# Patient Record
Sex: Female | Born: 1990 | Race: White | Hispanic: No | Marital: Single | Smoking: Never smoker
Health system: Southern US, Community
[De-identification: ages and names within clinical notes are randomized; demographics above are authoritative.]

---

## 2016-04-27 ENCOUNTER — Emergency Department
Admission: EM | Admit: 2016-04-27 | Discharge: 2016-04-27 | Disposition: A | Discharge status: Home / Self Care | Payer: BLUE CROSS/BLUE SHIELD | Attending: Emergency Medicine | Admitting: Emergency Medicine

## 2016-04-27 ENCOUNTER — Emergency Department: Payer: BLUE CROSS/BLUE SHIELD

## 2016-04-27 DIAGNOSIS — N12 Tubulo-interstitial nephritis, not specified as acute or chronic: Secondary | ICD-10-CM | POA: Insufficient documentation

## 2016-04-27 DIAGNOSIS — Z79899 Other long term (current) drug therapy: Secondary | ICD-10-CM | POA: Insufficient documentation

## 2016-04-27 DIAGNOSIS — R102 Pelvic and perineal pain: Secondary | ICD-10-CM | POA: Insufficient documentation

## 2016-04-27 DIAGNOSIS — R112 Nausea with vomiting, unspecified: Secondary | ICD-10-CM

## 2016-04-27 DIAGNOSIS — R1031 Right lower quadrant pain: Secondary | ICD-10-CM | POA: Diagnosis present

## 2016-04-27 LAB — COMPREHENSIVE METABOLIC PANEL
ALBUMIN: 3.6 g/dL (ref 3.5–5.0)
ALK PHOS: 65 U/L (ref 38–126)
ALT: 21 U/L (ref 14–54)
AST: 35 U/L (ref 15–41)
Anion gap: 7 (ref 5–15)
BUN: 13 mg/dL (ref 6–20)
CALCIUM: 9.1 mg/dL (ref 8.9–10.3)
CO2: 25 mmol/L (ref 22–32)
CREATININE: 0.86 mg/dL (ref 0.44–1.00)
Chloride: 105 mmol/L (ref 101–111)
GFR calc Af Amer: >60 mL/min (ref >60)
GFR calc non Af Amer: >60 mL/min (ref >60)
GLUCOSE: 90 mg/dL (ref 65–99)
Potassium: 3.5 mmol/L (ref 3.5–5.1)
SODIUM: 137 mmol/L (ref 135–145)
Total Bilirubin: 0.5 mg/dL (ref 0.3–1.2)
Total Protein: 7.7 g/dL (ref 6.5–8.1)

## 2016-04-27 LAB — URINALYSIS, COMPLETE (UACMP) WITH MICROSCOPIC
Bilirubin Urine: NEGATIVE
GLUCOSE, UA: NEGATIVE mg/dL
Ketones, ur: NEGATIVE mg/dL
Nitrite: NEGATIVE
PROTEIN: NEGATIVE mg/dL
Specific Gravity, Urine: 1.006 (ref 1.005–1.030)
pH: 8 (ref 5.0–8.0)

## 2016-04-27 LAB — CBC
HCT: 37.9 % (ref 35.0–47.0)
HEMOGLOBIN: 12.7 g/dL (ref 12.0–16.0)
MCH: 29.3 pg (ref 26.0–34.0)
MCHC: 33.4 g/dL (ref 32.0–36.0)
MCV: 87.7 fL (ref 80.0–100.0)
Platelets: 270 10*3/uL (ref 150–440)
RBC: 4.32 MIL/uL (ref 3.80–5.20)
RDW: 16.5 % — ABNORMAL HIGH (ref 11.5–14.5)
WBC: 15 10*3/uL — ABNORMAL HIGH (ref 3.6–11.0)

## 2016-04-27 LAB — POCT PREGNANCY, URINE: PREG TEST UR: NEGATIVE

## 2016-04-27 LAB — LIPASE, BLOOD: Lipase: 19 U/L (ref 11–51)

## 2016-04-27 LAB — HCG, QUANTITATIVE, PREGNANCY: hCG, Beta Chain, Quant, S: <1 m[IU]/mL (ref <5)

## 2016-04-27 MED ORDER — ONDANSETRON 4 MG PO TBDP
8.0000 mg | ORAL_TABLET | Freq: Once | ORAL | Status: AC
  Administered 2016-04-27: 8 mg via ORAL
  Filled 2016-04-27: qty 2

## 2016-04-27 MED ORDER — NAPROXEN 500 MG PO TABS: 500.0000 mg | ORAL_TABLET | Freq: Two times a day (BID) | ORAL | 0 refills | Status: AC

## 2016-04-27 MED ORDER — HYDROMORPHONE HCL 1 MG/ML IJ SOLN
1.0000 mg | INTRAMUSCULAR | Status: AC
  Administered 2016-04-27: 1 mg via INTRAVENOUS
  Filled 2016-04-27: qty 1

## 2016-04-27 MED ORDER — MORPHINE SULFATE (PF) 4 MG/ML IV SOLN
4.0000 mg | Freq: Once | INTRAVENOUS | Status: AC
Start: 2016-04-27 — End: 2016-04-27
  Administered 2016-04-27: 4 mg via INTRAVENOUS
  Filled 2016-04-27: qty 1

## 2016-04-27 MED ORDER — CEFTRIAXONE SODIUM-DEXTROSE 1-3.74 GM-% IV SOLR
1.0000 g | Freq: Once | INTRAVENOUS | Status: AC
  Administered 2016-04-27: 1 g via INTRAVENOUS
  Filled 2016-04-27: qty 50

## 2016-04-27 MED ORDER — SODIUM CHLORIDE 0.9 % IV BOLUS (SEPSIS)
1000.0000 mL | Freq: Once | INTRAVENOUS | Status: AC
  Administered 2016-04-27: 1000 mL via INTRAVENOUS

## 2016-04-27 MED ORDER — METOCLOPRAMIDE HCL 5 MG/ML IJ SOLN
10.0000 mg | Freq: Once | INTRAMUSCULAR | Status: AC
  Administered 2016-04-27: 10 mg via INTRAVENOUS
  Filled 2016-04-27: qty 2

## 2016-04-27 MED ORDER — ONDANSETRON 4 MG PO TBDP: 4.0000 mg | ORAL_TABLET | Freq: Three times a day (TID) | ORAL | 0 refills | Status: AC | PRN

## 2016-04-27 MED ORDER — IOPAMIDOL (ISOVUE-300) INJECTION 61%
15.0000 mL | INTRAVENOUS | Status: AC
  Administered 2016-04-27 (×2): 15 mL via ORAL

## 2016-04-27 MED ORDER — MORPHINE SULFATE (PF) 4 MG/ML IV SOLN
INTRAVENOUS | Status: AC
  Filled 2016-04-27: qty 1

## 2016-04-27 MED ORDER — ONDANSETRON HCL 4 MG/2ML IJ SOLN
4.0000 mg | Freq: Once | INTRAMUSCULAR | Status: AC | PRN
  Administered 2016-04-27: 4 mg via INTRAVENOUS
  Filled 2016-04-27: qty 2

## 2016-04-27 MED ORDER — DEXTROSE 5 % IV BOLUS
1000.0000 mL | Freq: Once | INTRAVENOUS | Status: AC
  Administered 2016-04-27: 1000 mL via INTRAVENOUS
  Filled 2016-04-27: qty 1000

## 2016-04-27 MED ORDER — MORPHINE SULFATE (PF) 4 MG/ML IV SOLN
4.0000 mg | Freq: Once | INTRAVENOUS | Status: AC
  Administered 2016-04-27: 4 mg via INTRAVENOUS

## 2016-04-27 MED ORDER — IOPAMIDOL (ISOVUE-300) INJECTION 61%
75.0000 mL | Freq: Once | INTRAVENOUS | Status: AC | PRN
  Administered 2016-04-27: 75 mL via INTRAVENOUS

## 2016-04-27 MED ORDER — DEXTROSE 5 % IV SOLN: 1.0000 g | Freq: Once | INTRAVENOUS | Status: DC

## 2016-04-27 MED ORDER — CEFPODOXIME PROXETIL 100 MG PO TABS
100.0000 mg | ORAL_TABLET | Freq: Two times a day (BID) | ORAL | 0 refills | Status: AC
Start: 2016-04-27 — End: ?

## 2016-04-27 NOTE — ED Notes (Signed)
Pt still in [place]

## 2016-04-27 NOTE — ED Triage Notes (Signed)
Pt reports vomiting and abdominal pain that began this morning.

## 2016-04-27 NOTE — ED Notes (Signed)
Kristi Kennedy was at bedside to discharge pt. Pt threw up all over floor and RN. Dr Scotty Courtstafford notified. Will treat with fluids and anitemetic and in 30 min if sx resolve dc.

## 2016-04-27 NOTE — ED Notes (Signed)
Aware of need for urine specimen.

## 2016-04-27 NOTE — ED Notes (Signed)
Patient transported to Ultrasound 

## 2016-04-27 NOTE — ED Provider Notes (Signed)
[facility] Emergency Department Provider Note  ____________________________________________  Time seen: Approximately 8:52 AM  I have reviewed the triage vital signs and the nursing notes.   HISTORY  Chief Complaint Abdominal Pain and Emesis    HPI Kristi Kennedy is a 26 y.o. female who was awakened this morning at about 6:45 AM by sudden severe right lower quadrant abdominal pain radiating through to her back. Never had pain like this before. Associated with vomiting and nausea, no diarrhea. No constipation. No history of ovarian cysts. No prior abdominal surgeries. Currently on menses which is normal timing for her. Reports some dysuria this morning but otherwise recently no urinary symptoms. No vaginal discharge.  Pain is worse with movement, no alleviating factors. Severe. Sharp.  No past medical history on file. Aortic regurgitation  There are no active problems to display for this patient.    No past surgical history on file. None  Prior to Admission medications   Medication Sig Start Date End Date Taking? Authorizing Provider  buPROPion (WELLBUTRIN XL) 300 MG 24 hr tablet Take 300 mg by mouth daily.   Yes Historical Provider, MD  escitalopram (LEXAPRO) 20 MG tablet Take 30 mg by mouth daily. 03/12/16  Yes Historical Provider, MD  [name] 3-0.02 MG tablet Take 1 tablet by mouth daily. 03/28/16  Yes Historical Provider, MD  QUEtiapine (SEROQUEL) 200 MG tablet Take 200 mg by mouth at bedtime.   Yes Historical Provider, MD  cefpodoxime (VANTIN) 100 MG tablet Take 1 tablet (100 mg total) by mouth 2 (two) times daily. 04/27/16   Sharman CheekPhillip Jacquees Gongora, MD  naproxen (NAPROSYN) 500 MG tablet Take 1 tablet (500 mg total) by mouth 2 (two) times daily with a meal. 04/27/16   Sharman CheekPhillip Meeya Goldin, MD  ondansetron (ZOFRAN ODT) 4 MG disintegrating tablet Take 1 tablet (4 mg total) by mouth every 8 (eight) hours as needed for nausea or vomiting. 04/27/16   Sharman CheekPhillip Anyah Swallow,  MD  None   Allergies Sulfa antibiotics   No family history on file.  Social History Social History  Substance Use Topics  . Smoking status: Not on file  . Smokeless tobacco: Not on file  . Alcohol use Not on file  No tobacco alcohol or drug use  Review of Systems  Constitutional:   No fever or chills.  ENT:   No sore throat. No rhinorrhea. Cardiovascular:   No chest pain. Respiratory:   No dyspnea or cough. Gastrointestinal:   Positive as above for right lower quadrant abdominal pain with vomiting..  Genitourinary:   Positive dysuria. Musculoskeletal:   Negative for focal pain or swelling Neurological:   Negative for headaches 10-point ROS otherwise negative.  ____________________________________________   PHYSICAL EXAM:  VITAL SIGNS: ED Triage Vitals  Enc Vitals Group     BP 04/27/16 0831 (!) 151/103     Pulse Rate 04/27/16 0831 (!) 108     Resp 04/27/16 0831 18     Temp 04/27/16 0842 97.7 F (36.5 C)     Temp Source 04/27/16 0842 Oral     SpO2 04/27/16 0831 100 %     Weight 04/27/16 0832 105 lb (47.6 kg)     Height 04/27/16 0832 5\' 5"  (1.651 m)     Head Circumference --      Peak Flow --      Pain Score 04/27/16 0832 8     Pain Loc --      Pain Edu? --      Excl. in GC? --  Vital signs reviewed, nursing assessments reviewed.   Constitutional:   Alert and oriented. Very uncomfortable but not in distress. Eyes:   No scleral icterus. No conjunctival pallor. PERRL. EOMI.  No nystagmus. ENT   Head:   Normocephalic and atraumatic.   Nose:   No congestion/rhinnorhea. No septal hematoma   Mouth/Throat:   MMM, no pharyngeal erythema. No peritonsillar mass.    Neck:   No stridor. No SubQ emphysema. No meningismus. Hematological/Lymphatic/Immunilogical:   No cervical lymphadenopathy. Cardiovascular:   RRR. Symmetric bilateral radial and DP pulses.  No murmurs.  Respiratory:   Normal respiratory effort without tachypnea nor retractions. Breath  sounds are clear and equal bilaterally. No wheezes/rales/rhonchi. Gastrointestinal:   Soft with focal right lower quadrant abdominal tenderness. Non distended. There is no CVA tenderness.  No rebound, rigidity, or guarding. Genitourinary:   deferred Musculoskeletal:   Nontender with normal range of motion in all extremities. No joint effusions.  No lower extremity tenderness.  No edema. Neurologic:   Normal speech and language.  CN 2-10 normal. Motor grossly intact. No gross focal neurologic deficits are appreciated.  Skin:    Skin is warm, dry and intact. No rash noted.  No petechiae, purpura, or bullae.  ____________________________________________    LABS (pertinent positives/negatives) (all labs ordered are listed, but only abnormal results are displayed) Labs Reviewed  CBC - Abnormal; Notable for the following:       Result Value   WBC 15.0 (*)    RDW 16.5 (*)    All other components within normal limits  URINALYSIS, COMPLETE (UACMP) WITH MICROSCOPIC - Abnormal; Notable for the following:    Color, Urine STRAW (*)    APPearance CLEAR (*)    Hgb urine dipstick MODERATE (*)    Leukocytes, UA SMALL (*)    Bacteria, UA RARE (*)    Squamous Epithelial / LPF 0-5 (*)    All other components within normal limits  LIPASE, BLOOD  COMPREHENSIVE METABOLIC PANEL  HCG, QUANTITATIVE, PREGNANCY  POC URINE PREG, ED  POCT PREGNANCY, URINE   ____________________________________________   EKG    ____________________________________________    RADIOLOGY  Ultrasound pelvis including Dopplers is unremarkable. CT abdomen and pelvis reveals some perinephric stranding and urothelial thickening consistent with either a recently passed kidney stone on the right side versus pyelonephritis.  ____________________________________________   PROCEDURES Procedures  ____________________________________________   INITIAL IMPRESSION / ASSESSMENT AND PLAN / ED COURSE  Pertinent labs &  imaging results that were available during my care of the patient were reviewed by me and considered in my medical decision making (see chart for details).  26 year old female presents with sudden severe right lower quadrant abdominal pain with tenderness. Reports current menses. Differential includes ovarian torsion, appendicitis, ectopic pregnancy, renal colic, cystitis, STI. Per the patient's report and her own suspicion, doubt sexually transmitted infection or pregnancy, we will obtain pregnancy test. Proceed immediately to ultrasound to evaluate for torsion. If negative, we'll need to obtain a CT scan. No relief after first dose of morphine, we'll give a second dose now. Nausea resolved after Zofran. Continue IV fluids.     Clinical Course as of Apr 27 1520  Mon Apr 27, 2016  0910 Still ongoing severe pain, not relieved by 2 doses of morphine. Called [place] to request study be done immediately with concern for torsion.  They state they will do it as soon as pregnancy test is resulted. Will try to expedite. Serum hcg has been added on.   [PS]  7829 U. HCG neg. [place] updated.   [PS]  1109 [place] neg. Will get CT to eval appy.   [PS]  1354 CT reviewed.  [PS]    Clinical Course User Index [PS] Sharman Cheek, MD    ----------------------------------------- 3:23 PM on 04/27/2016 -----------------------------------------  Patient feeling much better. Calm and comfortable. Persistent tachycardia, but vital signs otherwise unremarkable. His abdominal exam is improved. Clinically and in conjunction with the workup results, I think the patient has recently passed a kidney stone while in the emergency department which extends when she had severe unremitting pain initially and is now much more comfortable. However, because her urinalysis is somewhat equivocal we will send a urine culture and I'll start her on antibiotics. Gave the patient cephalexin in the ED and I'll continue her on cefpodoxime at home. Low  suspicion for STI or PID. ____________________________________________   FINAL CLINICAL IMPRESSION(S) / ED DIAGNOSES  Final diagnoses:  Right lower quadrant abdominal pain  Non-intractable vomiting with nausea, unspecified vomiting type  Pyelonephritis      New Prescriptions   CEFPODOXIME (VANTIN) 100 MG TABLET    Take 1 tablet (100 mg total) by mouth 2 (two) times daily.   NAPROXEN (NAPROSYN) 500 MG TABLET    Take 1 tablet (500 mg total) by mouth 2 (two) times daily with a meal.   ONDANSETRON (ZOFRAN ODT) 4 MG DISINTEGRATING TABLET    Take 1 tablet (4 mg total) by mouth every 8 (eight) hours as needed for nausea or vomiting.     Portions of this note were generated with dragon dictation software. Dictation errors may occur despite best attempts at proofreading.    Sharman Cheek, MD 04/27/16 1524

## 2016-04-27 NOTE — ED Notes (Signed)
Feels better and ready to go home

## 2016-04-27 NOTE — ED Notes (Signed)
Patient transported to CT 

## 2016-04-29 LAB — URINE CULTURE: Culture: >=100000 — AB

## 2017-07-01 IMAGING — CT CT ABD-PELV W/ CM
2 of 4 series · 15 of 46 positions shown, 17 images · IV contrast (APPLIED)
Comparison: None.

CLINICAL DATA: Right lower quadrant pain started this morning

EXAM:
CT ABDOMEN AND PELVIS WITH CONTRAST
TECHNIQUE: Multidetector CT imaging of the abdomen and pelvis was performed
using the standard protocol following bolus administration of
intravenous contrast.
CONTRAST:  75mL OQZYO4-FEE IOPAMIDOL (OQZYO4-FEE) INJECTION 61%

[Series 2: routine abd/pel with · axial · 0.59mm/px · z∈[-982,-597]mm · 12 of 88 slices shown, 14 images]
[im 7/88  soft-tissue]
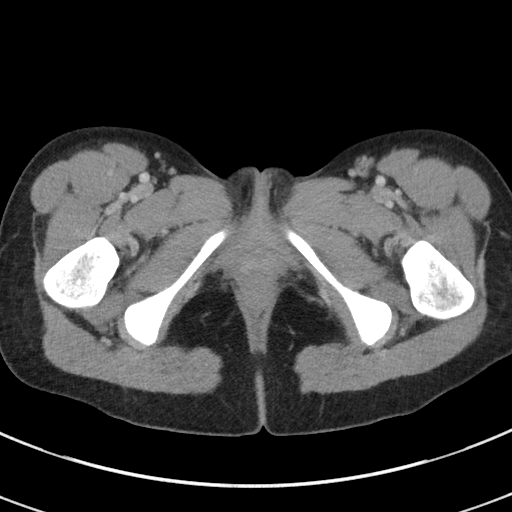
[im 7/88  bone]
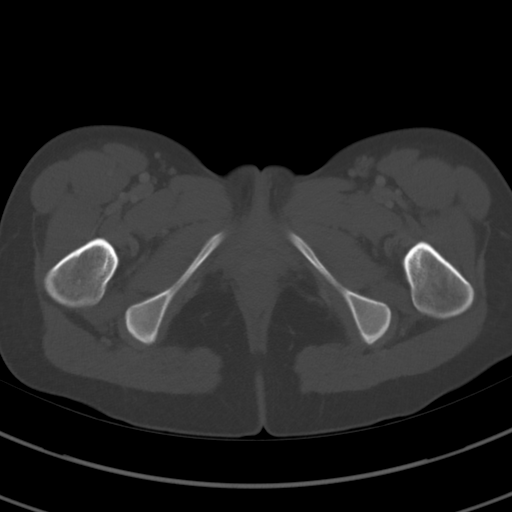
[im 14/88  soft-tissue]
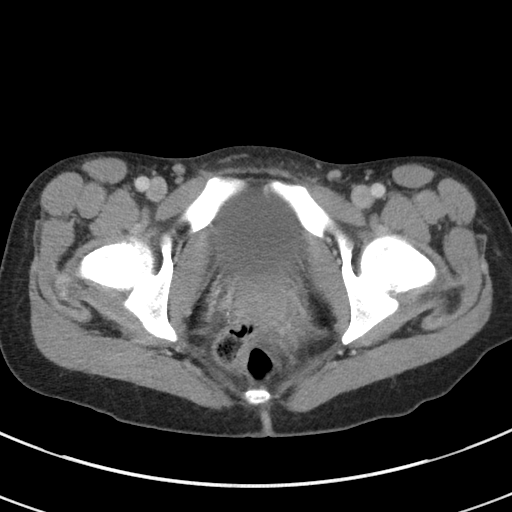
[im 21/88  soft-tissue]
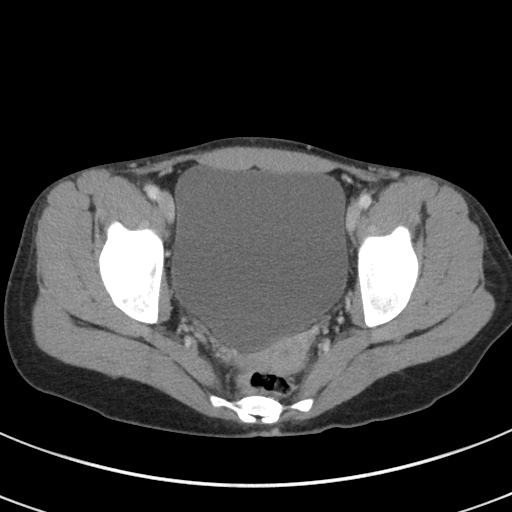
[im 28/88  soft-tissue]
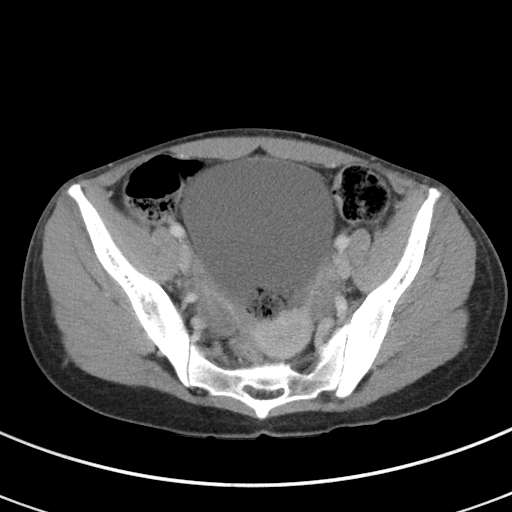
[im 35/88  soft-tissue]
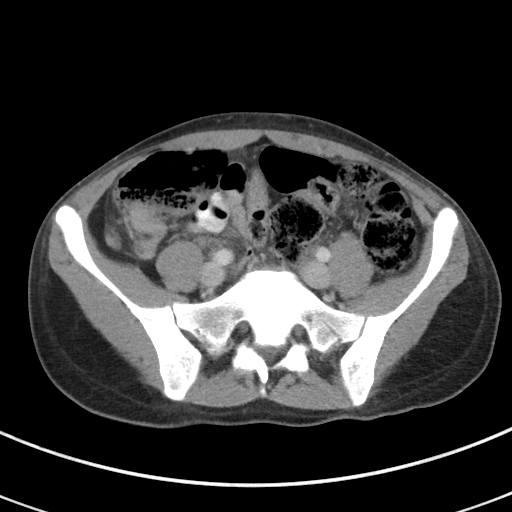
[im 42/88  soft-tissue]
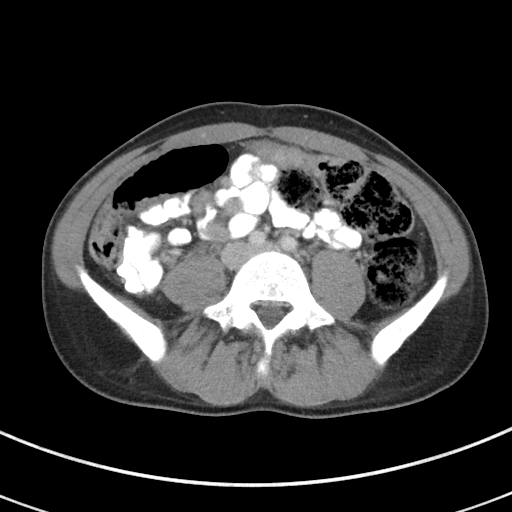
[im 49/88  soft-tissue]
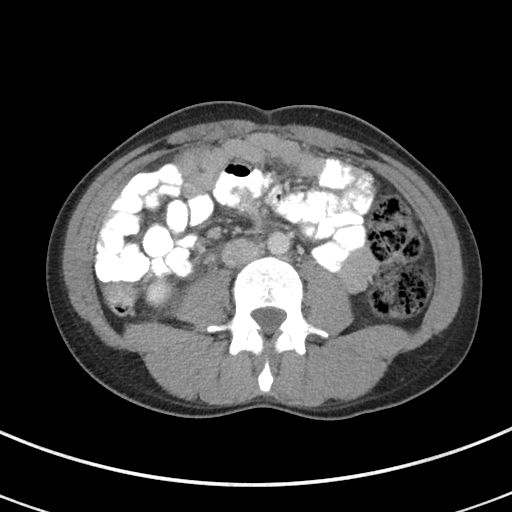
[im 56/88  soft-tissue]
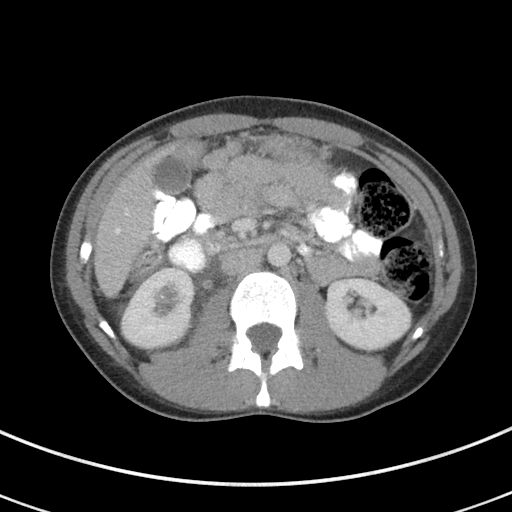
[im 63/88  soft-tissue]
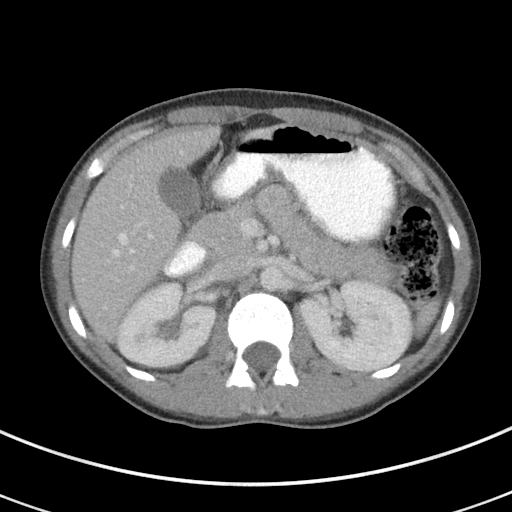
[im 63/88  bone]
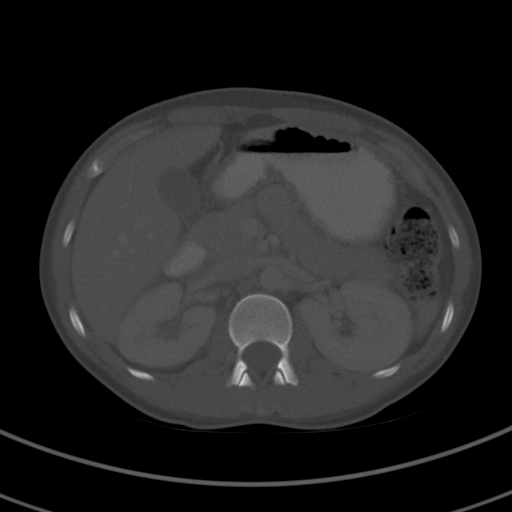
[im 70/88  soft-tissue]
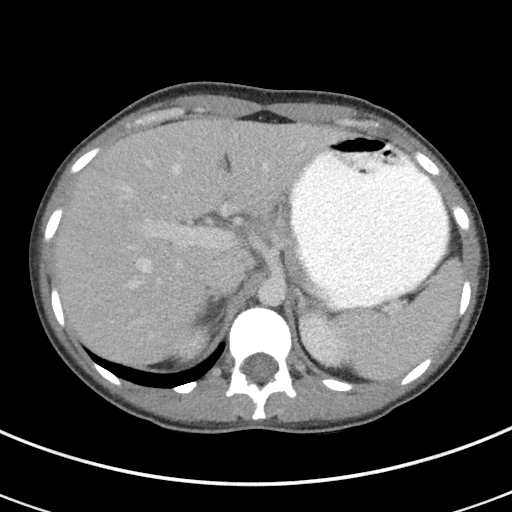
[im 77/88  soft-tissue]
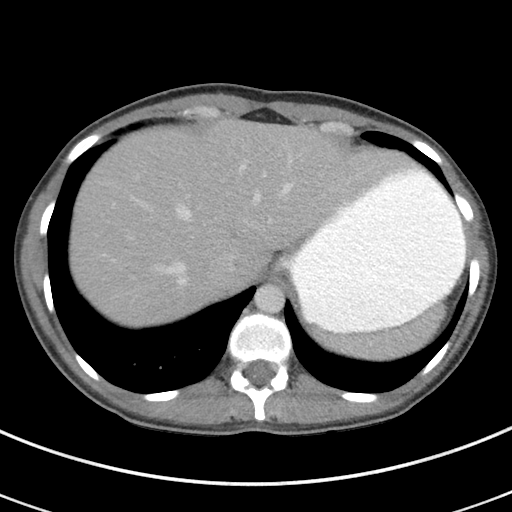
[im 84/88  soft-tissue]
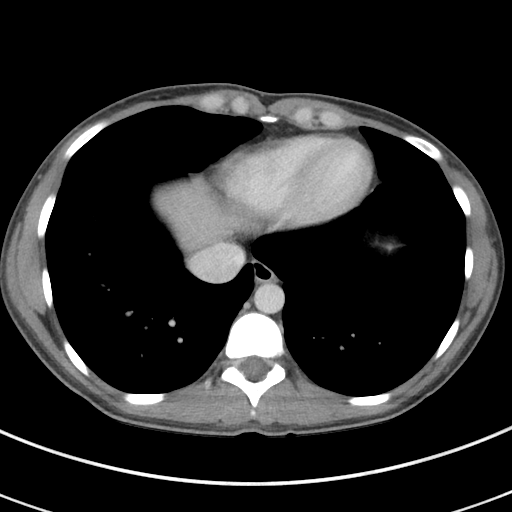

[Series 5: coronal st · coronal · 0.60mm/px · 3 of 70 slices shown]
[im 24/70  soft-tissue]
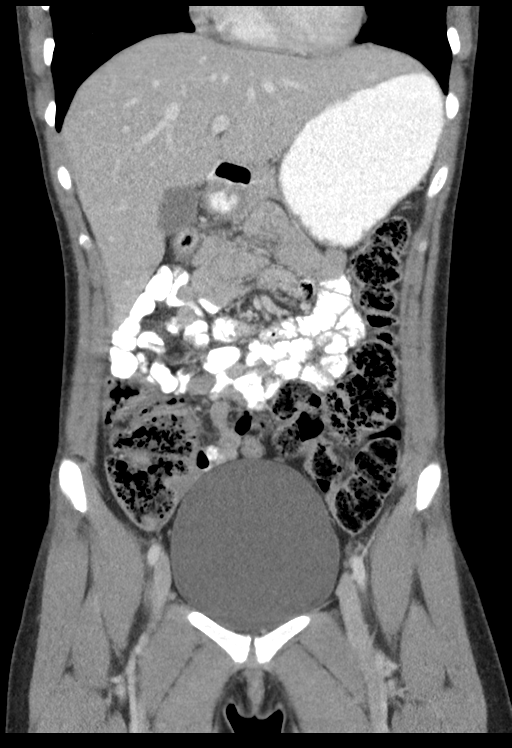
[im 31/70  soft-tissue]
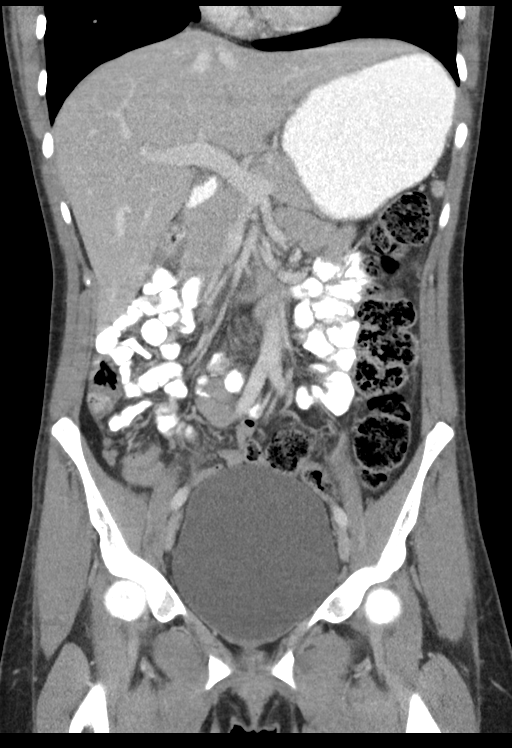
[im 39/70  soft-tissue]
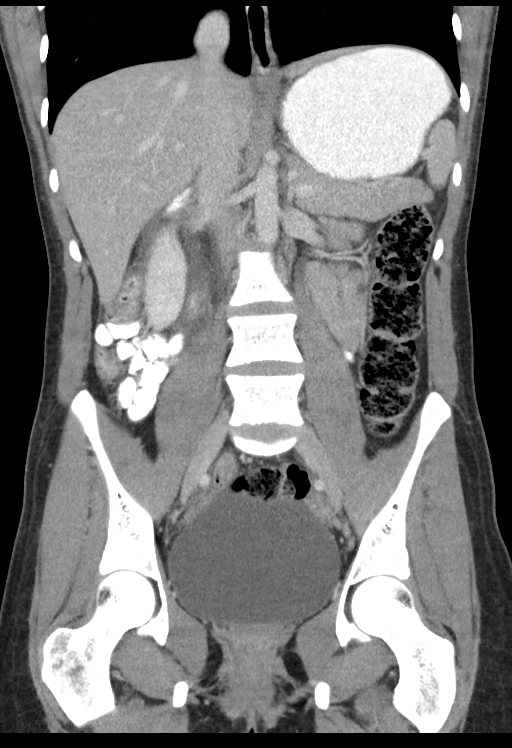

[15 of 46 positions shown; findings below may reference images not displayed]

FINDINGS: Lower chest: No acute abnormality.

Hepatobiliary: No focal liver abnormality is seen. No gallstones,
gallbladder wall thickening, or biliary dilatation.

Pancreas: Unremarkable. No pancreatic ductal dilatation or
surrounding inflammatory changes.

Spleen: Normal in size without focal abnormality.

Adrenals/Urinary Tract: Adrenal glands are unremarkable. Left kidney
is normal. No left urolithiasis. Punctate nonobstructing right renal
calculus. Mild right ureterectasis with urothelial enhancement
without ureterolithiasis. Right perinephric fat stranding. Bladder
is unremarkable.

Stomach/Bowel: Stomach is within normal limits. Normal caliber
appendix without thickening or significant surrounding inflammatory
changes. Large amount of stool throughout the colon. No evidence of
bowel wall thickening, distention, or inflammatory changes.

Vascular/Lymphatic: No significant vascular findings are present. No
enlarged abdominal or pelvic lymph nodes.

Reproductive: Uterus and bilateral adnexa are unremarkable.

Other: No abdominal wall hernia or abnormality. No abdominopelvic
ascites.

Musculoskeletal: No acute or significant osseous findings.
IMPRESSION: 1. Punctate nonobstructing right renal calculus. Mild right
ureterectasis with urothelial enhancement without ureterolithiasis
and perinephric fat stranding which may reflect a recently passed
calculus versus pyelonephritis.
2. Normal appendix.

## 2018-10-27 DIAGNOSIS — F603 Borderline personality disorder: Secondary | ICD-10-CM | POA: Insufficient documentation

## 2018-10-27 DIAGNOSIS — F331 Major depressive disorder, recurrent, moderate: Secondary | ICD-10-CM | POA: Insufficient documentation

## 2018-12-12 ENCOUNTER — Ambulatory Visit (HOSPITAL_COMMUNITY): Payer: 59 | Admitting: Psychiatry

## 2018-12-21 ENCOUNTER — Other Ambulatory Visit: Payer: Self-pay

## 2018-12-21 ENCOUNTER — Encounter (HOSPITAL_COMMUNITY): Payer: Self-pay | Admitting: Psychiatry

## 2018-12-21 ENCOUNTER — Ambulatory Visit (INDEPENDENT_AMBULATORY_CARE_PROVIDER_SITE_OTHER): Payer: 59 | Admitting: Psychiatry

## 2018-12-21 VITALS — Ht 65.0 in | Wt 135.0 lb

## 2018-12-21 DIAGNOSIS — F603 Borderline personality disorder: Secondary | ICD-10-CM | POA: Diagnosis not present

## 2018-12-21 DIAGNOSIS — F331 Major depressive disorder, recurrent, moderate: Secondary | ICD-10-CM

## 2018-12-21 MED ORDER — ESCITALOPRAM OXALATE 20 MG PO TABS: 20.0000 mg | ORAL_TABLET | Freq: Every day | ORAL | 1 refills | Status: DC

## 2018-12-21 MED ORDER — BUPROPION HCL ER (XL) 150 MG PO TB24: 150.0000 mg | ORAL_TABLET | Freq: Every day | ORAL | 1 refills | Status: DC

## 2018-12-21 MED ORDER — QUETIAPINE FUMARATE 200 MG PO TABS: 200.0000 mg | ORAL_TABLET | Freq: Every day | ORAL | 1 refills | Status: DC
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
## 2019-01-20 ENCOUNTER — Ambulatory Visit (HOSPITAL_COMMUNITY): Payer: 59 | Admitting: Psychiatry

## 2019-02-18 ENCOUNTER — Other Ambulatory Visit: Payer: Self-pay

## 2019-02-18 ENCOUNTER — Encounter (HOSPITAL_BASED_OUTPATIENT_CLINIC_OR_DEPARTMENT_OTHER): Payer: Self-pay | Admitting: Emergency Medicine

## 2019-02-18 ENCOUNTER — Other Ambulatory Visit (HOSPITAL_COMMUNITY): Payer: Self-pay | Admitting: Psychiatry

## 2019-02-18 ENCOUNTER — Emergency Department (HOSPITAL_BASED_OUTPATIENT_CLINIC_OR_DEPARTMENT_OTHER)
Admission: EM | Admit: 2019-02-18 | Discharge: 2019-02-18 | Disposition: A | Discharge status: Home / Self Care | Payer: 59 | Attending: Emergency Medicine | Admitting: Emergency Medicine

## 2019-02-18 DIAGNOSIS — R519 Headache, unspecified: Secondary | ICD-10-CM | POA: Diagnosis present

## 2019-02-18 DIAGNOSIS — L731 Pseudofolliculitis barbae: Secondary | ICD-10-CM | POA: Diagnosis not present

## 2019-02-18 DIAGNOSIS — Z79899 Other long term (current) drug therapy: Secondary | ICD-10-CM | POA: Diagnosis not present

## 2019-02-18 MED ORDER — ACETAMINOPHEN 500 MG PO TABS
1000.0000 mg | ORAL_TABLET | Freq: Once | ORAL | Status: AC
  Administered 2019-02-18: 04:00:00 1000 mg via ORAL
  Filled 2019-02-18: qty 2

## 2019-02-18 MED ORDER — IBUPROFEN 800 MG PO TABS
800.0000 mg | ORAL_TABLET | Freq: Once | ORAL | Status: AC
  Administered 2019-02-18: 800 mg via ORAL
  Filled 2019-02-18: qty 1

## 2019-02-18 NOTE — ED Triage Notes (Signed)
Patient presents with complaints of marble sized "bump" near mouth area 3-4 weeks ago; states recently noticed a "bump" on her head; denies any trauma; complains of a headache x "a couple of weeks" patient also co feeling lightheaded and dizzy x "a few" days with sx worse today. Ambulatory with steady gait. Denies any sx of NVD that she can remember.

## 2019-02-18 NOTE — ED Provider Notes (Signed)
[facility] EMERGENCY DEPARTMENT Provider Note   CSN: [id number] Arrival date & time: 02/18/19  9030     History   Chief Complaint Chief Complaint  Patient presents with  . Headache    HPI Kristi Kennedy is a 28 y.o. female.     The history is provided by the patient.  Headache Location: crown of the head where there is a "bump" she had 2 on her face. one on the forehead one near the mouth, both have resolved.  Also only drinking one bottle of water a day and feels lightheaded.   Quality:  Dull Radiates to:  Does not radiate Severity currently:  5/10 Severity at highest:  5/10 Onset quality:  Gradual Timing:  Constant Progression:  Unchanged Chronicity:  New Context: not activity, not exposure to bright light, not caffeine, not coughing, not defecating, not eating, not stress and not exposure to cold air   Relieved by:  Nothing Worsened by:  Nothing Ineffective treatments:  None tried Associated symptoms: no abdominal pain, no back pain, no blurred vision, no congestion, no cough, no diarrhea, no dizziness, no drainage, no ear pain, no eye pain, no facial pain, no fatigue, no fever, no focal weakness, no hearing loss, no loss of balance, no myalgias, no nausea, no near-syncope, no neck pain, no neck stiffness, no numbness, no paresthesias, no photophobia, no seizures, no sinus pressure, no sore throat, no swollen glands, no syncope, no tingling, no URI, no visual change, no vomiting and no weakness   Risk factors: no anger     History reviewed. No pertinent past medical history.  Patient Active Problem List   Diagnosis Date Noted  . Moderate episode of recurrent major depressive disorder (HCC) 10/27/2018  . Borderline personality disorder (HCC) 10/27/2018    Past Surgical History:  Procedure Laterality Date  . HERNIA REPAIR       OB History   No obstetric history on file.      Home Medications    Prior to Admission medications   Medication  Sig Start Date End Date Taking? Authorizing Provider  buPROPion (WELLBUTRIN XL) 150 MG 24 hr tablet Take 1 tablet (150 mg total) by mouth daily. 12/21/18   Thresa Ross, MD  cefpodoxime (VANTIN) 100 MG tablet Take 1 tablet (100 mg total) by mouth 2 (two) times daily. 04/27/16   Sharman Cheek, MD  escitalopram (LEXAPRO) 20 MG tablet Take 1 tablet (20 mg total) by mouth daily. 12/21/18   Thresa Ross, MD  naproxen (NAPROSYN) 500 MG tablet Take 1 tablet (500 mg total) by mouth 2 (two) times daily with a meal. 04/27/16   Sharman Cheek, MD  [name] 3-0.02 MG tablet Take 1 tablet by mouth daily. 03/28/16   [provider]  ondansetron (ZOFRAN ODT) 4 MG disintegrating tablet Take 1 tablet (4 mg total) by mouth every 8 (eight) hours as needed for nausea or vomiting. 04/27/16   Sharman Cheek, MD  QUEtiapine (SEROQUEL) 200 MG tablet Take 1 tablet (200 mg total) by mouth at bedtime. 12/21/18   Thresa Ross, MD    Family History History reviewed. No pertinent family history.  Social History Social History   Tobacco Use  . Smoking status: Never Smoker  . Smokeless tobacco: Never Used  Substance Use Topics  . Alcohol use: Yes  . Drug use: Never     Allergies   Sulfa antibiotics   Review of Systems Review of Systems  Constitutional: Negative for fatigue and fever.  HENT: Negative for  congestion, ear pain, hearing loss, postnasal drip, sinus pressure and sore throat.   Eyes: Negative for blurred vision, photophobia and pain.  Respiratory: Negative for cough.   Cardiovascular: Negative for syncope and near-syncope.  Gastrointestinal: Negative for abdominal pain, diarrhea, nausea and vomiting.  Genitourinary: Negative for difficulty urinating.  Musculoskeletal: Negative for back pain, myalgias, neck pain and neck stiffness.  Neurological: Positive for light-headedness and headaches. Negative for dizziness, tremors, focal weakness, seizures, syncope, facial asymmetry, speech  difficulty, weakness, numbness, paresthesias and loss of balance.  Psychiatric/Behavioral: Negative for agitation.  All other systems reviewed and are negative.    Physical Exam Updated Vital Signs BP (!) 140/99 (BP Location: Right Arm)   Pulse 89   Temp 98.7 F (37.1 C) (Oral)   Resp (!) 25   Ht 5\' 5"  (1.651 m)   Wt 61.2 kg   LMP 01/28/2019   SpO2 100%   BMI 22.47 kg/m   Physical Exam Vitals signs and nursing note reviewed.  Constitutional:      General: She is not in acute distress.    Appearance: She is normal weight.  HENT:     Head: Normocephalic and atraumatic.     Jaw: There is normal jaw occlusion.      Comments: No proptosis intact cognition    Mouth/Throat:     Mouth: Mucous membranes are moist.  Eyes:     Extraocular Movements: Extraocular movements intact.     Pupils: Pupils are equal, round, and reactive to light.  Neck:     Musculoskeletal: Normal range of motion and neck supple.  Cardiovascular:     Rate and Rhythm: Normal rate and regular rhythm.     Pulses: Normal pulses.     Heart sounds: Normal heart sounds.  Pulmonary:     Effort: Pulmonary effort is normal.     Breath sounds: Normal breath sounds.  Abdominal:     General: Abdomen is flat. Bowel sounds are normal.  Musculoskeletal: Normal range of motion.  Lymphadenopathy:     Cervical: No cervical adenopathy.  Skin:    General: Skin is warm and dry.     Capillary Refill: Capillary refill takes less than 2 seconds.  Neurological:     General: No focal deficit present.     Mental Status: She is alert and oriented to person, place, and time.     Cranial Nerves: No cranial nerve deficit.     Sensory: No sensory deficit.     Motor: No weakness.     Deep Tendon Reflexes: Reflexes normal.  Psychiatric:        Mood and Affect: Mood normal.        Behavior: Behavior normal.      ED Treatments / Results  Labs (all labs ordered are listed, but only abnormal results are displayed) Labs  Reviewed - No data to display  EKG None  Radiology No results found.  Procedures Procedures (including critical care time)  Medications Ordered in ED Medications  acetaminophen (TYLENOL) tablet 1,000 mg (has no administration in time range)  ibuprofen (ADVIL) tablet 800 mg (has no administration in time range)     Initial Impression / Assessment and Plan / ED Course  Pain is under an ingrown hair.  Tylenol for pain and warm compresses.  I suspect the other 2 lesions that went away were acne as patient have this on her facial skin.  No weakness or numbness.  No signs of ICH or meningitis or cavernous sinus thrombosis.  Patient is not drinking enough water and this accounts for lightheadedness.    Keysi Oelkers was evaluated in Emergency Department on 02/18/2019 for the symptoms described in the history of present illness. She was evaluated in the context of the global COVID-19 pandemic, which necessitated consideration that the patient might be at risk for infection with the SARS-CoV-2 virus that causes COVID-19. Institutional protocols and algorithms that pertain to the evaluation of patients at risk for COVID-19 are in a state of rapid change based on information released by regulatory bodies including the CDC and federal and state organizations. These policies and algorithms were followed during the patient's care in the ED.   Final Clinical Impressions(s) / ED Diagnoses   Final diagnoses:  Ingrown hair    Return for weakness, numbness, changes in vision or speech, fevers >100.4 unrelieved by medication, shortness of breath, intractable vomiting, or diarrhea, abdominal pain, Inability to tolerate liquids or food, cough, altered mental status or any concerns. No signs of systemic illness or infection. The patient is nontoxic-appearing on exam and vital signs are within normal limits.   I have reviewed the triage vital signs and the nursing notes. Pertinent labs &imaging  results that were available during my care of the patient were reviewed by me and considered in my medical decision making (see chart for details).  After history, exam, and medical workup I feel the patient has been appropriately medically screened and is safe for discharge home. Pertinent diagnoses were discussed with the patient. Patient was given return precautions      Amry Cathy, MD 02/18/19 [id number]

## 2019-03-20 ENCOUNTER — Other Ambulatory Visit (HOSPITAL_COMMUNITY): Payer: Self-pay | Admitting: Psychiatry

## 2019-04-19 ENCOUNTER — Other Ambulatory Visit (HOSPITAL_COMMUNITY): Payer: Self-pay | Admitting: Psychiatry

## 2019-05-26 ENCOUNTER — Other Ambulatory Visit (HOSPITAL_COMMUNITY): Payer: Self-pay | Admitting: Psychiatry

## 2019-05-29 ENCOUNTER — Ambulatory Visit (HOSPITAL_COMMUNITY): Payer: 59 | Admitting: Psychiatry

## 2019-05-31 ENCOUNTER — Ambulatory Visit (INDEPENDENT_AMBULATORY_CARE_PROVIDER_SITE_OTHER): Payer: Self-pay | Admitting: Psychiatry

## 2019-05-31 ENCOUNTER — Encounter (HOSPITAL_COMMUNITY): Payer: Self-pay | Admitting: Psychiatry

## 2019-05-31 DIAGNOSIS — F603 Borderline personality disorder: Secondary | ICD-10-CM

## 2019-05-31 DIAGNOSIS — F331 Major depressive disorder, recurrent, moderate: Secondary | ICD-10-CM

## 2019-05-31 MED ORDER — QUETIAPINE FUMARATE 200 MG PO TABS: ORAL_TABLET | ORAL | 1 refills | Status: DC

## 2019-05-31 MED ORDER — ESCITALOPRAM OXALATE 20 MG PO TABS: 20.0000 mg | ORAL_TABLET | Freq: Every day | ORAL | 1 refills | Status: DC

## 2019-05-31 MED ORDER — BUPROPION HCL ER (XL) 150 MG PO TB24: 150.0000 mg | ORAL_TABLET | Freq: Every day | ORAL | 1 refills | Status: DC
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
## 2019-06-29 ENCOUNTER — Ambulatory Visit (HOSPITAL_COMMUNITY): Payer: Self-pay | Admitting: Psychiatry

## 2019-07-04 ENCOUNTER — Encounter (HOSPITAL_COMMUNITY): Payer: Self-pay | Admitting: Psychiatry

## 2019-07-04 ENCOUNTER — Ambulatory Visit (INDEPENDENT_AMBULATORY_CARE_PROVIDER_SITE_OTHER): Payer: Self-pay | Admitting: Psychiatry

## 2019-07-04 DIAGNOSIS — F331 Major depressive disorder, recurrent, moderate: Secondary | ICD-10-CM

## 2019-07-04 DIAGNOSIS — F603 Borderline personality disorder: Secondary | ICD-10-CM

## 2019-07-04 MED ORDER — QUETIAPINE FUMARATE 200 MG PO TABS: ORAL_TABLET | ORAL | 1 refills | Status: DC

## 2019-07-04 MED ORDER — BUPROPION HCL ER (XL) 150 MG PO TB24: 150.0000 mg | ORAL_TABLET | Freq: Every day | ORAL | 1 refills | Status: DC

## 2019-07-04 MED ORDER — ESCITALOPRAM OXALATE 20 MG PO TABS: 20.0000 mg | ORAL_TABLET | Freq: Every day | ORAL | 1 refills | Status: DC
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
## 2019-07-25 ENCOUNTER — Other Ambulatory Visit (HOSPITAL_COMMUNITY): Payer: Self-pay | Admitting: Psychiatry

## 2019-09-23 ENCOUNTER — Other Ambulatory Visit (HOSPITAL_COMMUNITY): Payer: Self-pay | Admitting: Psychiatry

## 2019-10-06 ENCOUNTER — Encounter (HOSPITAL_COMMUNITY): Payer: Self-pay | Admitting: Psychiatry

## 2019-10-06 ENCOUNTER — Telehealth (INDEPENDENT_AMBULATORY_CARE_PROVIDER_SITE_OTHER): Payer: Self-pay | Admitting: Psychiatry

## 2019-10-06 DIAGNOSIS — F603 Borderline personality disorder: Secondary | ICD-10-CM

## 2019-10-06 DIAGNOSIS — F331 Major depressive disorder, recurrent, moderate: Secondary | ICD-10-CM

## 2019-10-06 MED ORDER — BUPROPION HCL ER (XL) 150 MG PO TB24: 150.0000 mg | ORAL_TABLET | Freq: Every day | ORAL | 1 refills | Status: DC

## 2019-10-06 MED ORDER — QUETIAPINE FUMARATE 200 MG PO TABS: ORAL_TABLET | ORAL | 1 refills | Status: DC

## 2019-10-06 MED ORDER — ESCITALOPRAM OXALATE 20 MG PO TABS: 20.0000 mg | ORAL_TABLET | Freq: Every day | ORAL | 1 refills | Status: DC
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
## 2019-12-30 ENCOUNTER — Other Ambulatory Visit (HOSPITAL_COMMUNITY): Payer: Self-pay | Admitting: Psychiatry

## 2020-01-05 ENCOUNTER — Telehealth (INDEPENDENT_AMBULATORY_CARE_PROVIDER_SITE_OTHER): Payer: Self-pay | Admitting: Psychiatry

## 2020-01-05 ENCOUNTER — Encounter (HOSPITAL_COMMUNITY): Payer: Self-pay | Admitting: Psychiatry

## 2020-01-05 DIAGNOSIS — F331 Major depressive disorder, recurrent, moderate: Secondary | ICD-10-CM

## 2020-01-05 DIAGNOSIS — F603 Borderline personality disorder: Secondary | ICD-10-CM
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
## 2020-02-21 ENCOUNTER — Encounter (HOSPITAL_BASED_OUTPATIENT_CLINIC_OR_DEPARTMENT_OTHER): Payer: Self-pay | Admitting: *Deleted

## 2020-02-21 ENCOUNTER — Emergency Department (HOSPITAL_BASED_OUTPATIENT_CLINIC_OR_DEPARTMENT_OTHER)
Admission: EM | Admit: 2020-02-21 | Discharge: 2020-02-21 | Disposition: A | Discharge status: Home / Self Care | Payer: 59 | Attending: Emergency Medicine | Admitting: Emergency Medicine

## 2020-02-21 ENCOUNTER — Other Ambulatory Visit: Payer: Self-pay

## 2020-02-21 DIAGNOSIS — H5712 Ocular pain, left eye: Secondary | ICD-10-CM | POA: Diagnosis present

## 2020-02-21 DIAGNOSIS — H1032 Unspecified acute conjunctivitis, left eye: Secondary | ICD-10-CM | POA: Insufficient documentation

## 2020-02-21 DIAGNOSIS — H109 Unspecified conjunctivitis: Secondary | ICD-10-CM

## 2020-02-21 MED ORDER — ERYTHROMYCIN 5 MG/GM OP OINT
TOPICAL_OINTMENT | Freq: Once | OPHTHALMIC | Status: AC
  Administered 2020-02-21: 1 via OPHTHALMIC
  Filled 2020-02-21: qty 3.5

## 2020-02-21 NOTE — ED Triage Notes (Signed)
C/o left eye redness x 1 day

## 2020-02-21 NOTE — Discharge Instructions (Signed)
Please use antibiotic eye drop every 4 hours for the next 1 week Follow up with  your PCP for recheck of symptoms next week Return to the ED IMMEDIATELY for any worsening symptoms

## 2020-02-21 NOTE — ED Provider Notes (Signed)
[facility] EMERGENCY DEPARTMENT Provider Note   CSN: [id number] Arrival date & time: 02/21/20  1904     History Chief Complaint  Patient presents with   Eye Problem    Kristi Kennedy is a 29 y.o. female who presents to the ED today with complaint of gradual onset, constant, left eye redness that began this  Morning. Pt reports she woke up with her left eye swollen shut and matted with crusting. She had to use a wash cloth to wipe the crusting away but reports her eye has felt irritated since. She states that she has had watery drainage from the eye all day as well. Pt denies any eye pain. No pain with movement. No fevers or chills. No blurry vision or double vision. No other complaints. Pt does not wear contacts.   The history is provided by the patient and medical records.       History reviewed. No pertinent past medical history.  Patient Active Problem List   Diagnosis Date Noted   Moderate episode of recurrent major depressive disorder (HCC) 10/27/2018   Borderline personality disorder (HCC) 10/27/2018    Past Surgical History:  Procedure Laterality Date   HERNIA REPAIR       OB History   No obstetric history on file.     No family history on file.  Social History   Tobacco Use   Smoking status: Never Smoker   Smokeless tobacco: Never Used  Vaping Use   Vaping Use: Never used  Substance Use Topics   Alcohol use: Yes   Drug use: Never    Home Medications Prior to Admission medications   Medication Sig Start Date End Date Taking? Authorizing Provider  buPROPion (WELLBUTRIN XL) 150 MG 24 hr tablet TAKE ONE TABLET BY MOUTH ONE TIME DAILY 01/01/20   Thresa Ross, MD  cefpodoxime (VANTIN) 100 MG tablet Take 1 tablet (100 mg total) by mouth 2 (two) times daily. 04/27/16   Sharman Cheek, MD  escitalopram (LEXAPRO) 20 MG tablet TAKE ONE TABLET BY MOUTH ONE TIME DAILY 01/01/20   Thresa Ross, MD  naproxen (NAPROSYN) 500 MG tablet Take  1 tablet (500 mg total) by mouth 2 (two) times daily with a meal. 04/27/16   Sharman Cheek, MD  [name] 3-0.02 MG tablet Take 1 tablet by mouth daily. 03/28/16   [provider]  ondansetron (ZOFRAN ODT) 4 MG disintegrating tablet Take 1 tablet (4 mg total) by mouth every 8 (eight) hours as needed for nausea or vomiting. 04/27/16   Sharman Cheek, MD  QUEtiapine (SEROQUEL) 200 MG tablet TAKE ONE TABLET POI EVERY NIGHT AT BEDTIME 10/06/19   Thresa Ross, MD    Allergies    Sulfa antibiotics  Review of Systems   Review of Systems  Constitutional: Negative for chills and fever.  Eyes: Positive for discharge and redness. Negative for photophobia, pain and visual disturbance.    Physical Exam Updated Vital Signs BP (!) 124/98    Pulse (!) 102    Temp 98.3 F (36.8 C) (Oral)    Resp 16    Ht 5\' 6"  (1.676 m)    Wt 63.5 kg    LMP 02/07/2020    SpO2 100%    BMI 22.60 kg/m   Physical Exam Vitals and nursing note reviewed.  Constitutional:      Appearance: She is not ill-appearing.  HENT:     Head: Normocephalic and atraumatic.  Eyes:     Conjunctiva/sclera:     Left  eye: Left conjunctiva is injected.     Comments: + left conjunctival injection with watery discharge from eye. EOMI. No pain with EOM. No periorbital edema.   Cardiovascular:     Rate and Rhythm: Normal rate and regular rhythm.     Pulses: Normal pulses.  Pulmonary:     Effort: Pulmonary effort is normal.     Breath sounds: Normal breath sounds. No wheezing, rhonchi or rales.  Skin:    General: Skin is warm and dry.     Coloration: Skin is not jaundiced.  Neurological:     Mental Status: She is alert.     ED Results / Procedures / Treatments   Labs (all labs ordered are listed, but only abnormal results are displayed) Labs Reviewed - No data to display  EKG None  Radiology No results found.  Procedures Procedures (including critical care time)  Medications Ordered in ED Medications    erythromycin ophthalmic ointment (has no administration in time range)    ED Course  I have reviewed the triage vital signs and the nursing notes.  Pertinent labs & imaging results that were available during my care of the patient were reviewed by me and considered in my medical decision making (see chart for details).    MDM Rules/Calculators/A&P                          29 year old female presents to the ED today complaining of left eye redness/irritation with eye matted shut this morning with crusting surrounding it.  Patient without complaints of visual disturbances.  No pain with eye movement.  No foreign body sensation.  On arrival to the ED patient is afebrile, nontachypneic.  She is mildly tachycardic at 102 however this is dissipated while patient is back in her room.  She is nontoxic-appearing.  She does have a conjunctival injection on the left side with some watery discharge.  Her extraocular movements are intact.  Pulls equal round reactive to light.  There is no concern for preseptal/septal cellulitis at this time.  No trauma to the eye.  Do not feel patient needs fluorescein testing at this time.  We will plan to discharge with erythromycin ointment and have her follow-up with her PCP next week for recheck of symptoms.  Patient instructed to return to the ED for any worsening symptoms.  She is in agreement with plan is stable for discharge.   This note was prepared using Dragon voice recognition software and may include unintentional dictation errors due to the inherent limitations of voice recognition software.   Final Clinical Impression(s) / ED Diagnoses Final diagnoses:  Bacterial conjunctivitis of left eye    Rx / DC Orders ED Discharge Orders    None       Discharge Instructions     Please use antibiotic eye drop every 4 hours for the next 1 week Follow up with  your PCP for recheck of symptoms next week Return to the ED IMMEDIATELY for any worsening symptoms         Tanda Rockers, PA-C 02/21/20 1931    Gwyneth Sprout, MD 02/21/20 2234

## 2020-02-28 ENCOUNTER — Other Ambulatory Visit (HOSPITAL_COMMUNITY): Payer: Self-pay | Admitting: Psychiatry

## 2020-04-28 ENCOUNTER — Other Ambulatory Visit (HOSPITAL_COMMUNITY): Payer: Self-pay | Admitting: Psychiatry

## 2020-05-06 ENCOUNTER — Encounter (HOSPITAL_COMMUNITY): Payer: Self-pay | Admitting: Psychiatry

## 2020-05-06 ENCOUNTER — Telehealth (INDEPENDENT_AMBULATORY_CARE_PROVIDER_SITE_OTHER): Payer: Self-pay | Admitting: Psychiatry

## 2020-05-06 DIAGNOSIS — F603 Borderline personality disorder: Secondary | ICD-10-CM

## 2020-05-06 DIAGNOSIS — F331 Major depressive disorder, recurrent, moderate: Secondary | ICD-10-CM
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
## 2020-05-28 ENCOUNTER — Other Ambulatory Visit (HOSPITAL_COMMUNITY): Payer: Self-pay | Admitting: Psychiatry

## 2020-06-27 ENCOUNTER — Other Ambulatory Visit (HOSPITAL_COMMUNITY): Payer: Self-pay | Admitting: Psychiatry

## 2020-08-05 ENCOUNTER — Telehealth (INDEPENDENT_AMBULATORY_CARE_PROVIDER_SITE_OTHER): Payer: 59 | Admitting: Psychiatry

## 2020-08-05 ENCOUNTER — Encounter (HOSPITAL_COMMUNITY): Payer: Self-pay | Admitting: Psychiatry

## 2020-08-05 DIAGNOSIS — F603 Borderline personality disorder: Secondary | ICD-10-CM

## 2020-08-05 DIAGNOSIS — F331 Major depressive disorder, recurrent, moderate: Secondary | ICD-10-CM | POA: Diagnosis not present

## 2020-08-05 MED ORDER — ESCITALOPRAM OXALATE 20 MG PO TABS: 1.0000 | ORAL_TABLET | Freq: Every day | ORAL | 1 refills | Status: DC

## 2020-08-05 MED ORDER — BUPROPION HCL ER (XL) 150 MG PO TB24: 1.0000 | ORAL_TABLET | Freq: Every day | ORAL | 1 refills | Status: DC
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
## 2020-10-21 ENCOUNTER — Emergency Department (HOSPITAL_BASED_OUTPATIENT_CLINIC_OR_DEPARTMENT_OTHER): Payer: 59

## 2020-10-21 ENCOUNTER — Emergency Department (HOSPITAL_BASED_OUTPATIENT_CLINIC_OR_DEPARTMENT_OTHER)
Admission: EM | Admit: 2020-10-21 | Discharge: 2020-10-21 | Disposition: A | Discharge status: Home / Self Care | Payer: 59 | Attending: Emergency Medicine | Admitting: Emergency Medicine

## 2020-10-21 ENCOUNTER — Other Ambulatory Visit: Payer: Self-pay

## 2020-10-21 ENCOUNTER — Encounter (HOSPITAL_BASED_OUTPATIENT_CLINIC_OR_DEPARTMENT_OTHER): Payer: Self-pay | Admitting: *Deleted

## 2020-10-21 DIAGNOSIS — R6883 Chills (without fever): Secondary | ICD-10-CM | POA: Diagnosis not present

## 2020-10-21 DIAGNOSIS — R112 Nausea with vomiting, unspecified: Secondary | ICD-10-CM | POA: Diagnosis not present

## 2020-10-21 DIAGNOSIS — R000 Tachycardia, unspecified: Secondary | ICD-10-CM | POA: Insufficient documentation

## 2020-10-21 DIAGNOSIS — R110 Nausea: Secondary | ICD-10-CM

## 2020-10-21 LAB — COMPREHENSIVE METABOLIC PANEL
ALT: 20 U/L (ref 0–44)
AST: 29 U/L (ref 15–41)
Albumin: 4.8 g/dL (ref 3.5–5.0)
Alkaline Phosphatase: 64 U/L (ref 38–126)
Anion gap: 9 (ref 5–15)
BUN: 13 mg/dL (ref 6–20)
CO2: 24 mmol/L (ref 22–32)
Calcium: 9.8 mg/dL (ref 8.9–10.3)
Chloride: 105 mmol/L (ref 98–111)
Creatinine, Ser: 0.81 mg/dL (ref 0.44–1.00)
GFR, Estimated: >60 mL/min (ref >60)
Glucose, Bld: 118 mg/dL — ABNORMAL HIGH (ref 70–99)
Potassium: 3.8 mmol/L (ref 3.5–5.1)
Sodium: 138 mmol/L (ref 135–145)
Total Bilirubin: 0.3 mg/dL (ref 0.3–1.2)
Total Protein: 8.7 g/dL — ABNORMAL HIGH (ref 6.5–8.1)

## 2020-10-21 LAB — MAGNESIUM: Magnesium: 1.8 mg/dL (ref 1.7–2.4)

## 2020-10-21 LAB — URINALYSIS, ROUTINE W REFLEX MICROSCOPIC
Bilirubin Urine: NEGATIVE
Glucose, UA: NEGATIVE mg/dL
Hgb urine dipstick: NEGATIVE
Ketones, ur: >80 mg/dL — AB
Leukocytes,Ua: NEGATIVE
Nitrite: NEGATIVE
Protein, ur: NEGATIVE mg/dL
Specific Gravity, Urine: 1.01 (ref 1.005–1.030)
pH: 7 (ref 5.0–8.0)

## 2020-10-21 LAB — CBC WITH DIFFERENTIAL/PLATELET
Abs Immature Granulocytes: 0.05 10*3/uL (ref 0.00–0.07)
Basophils Absolute: 0 10*3/uL (ref 0.0–0.1)
Basophils Relative: 1 %
Eosinophils Absolute: 0 10*3/uL (ref 0.0–0.5)
Eosinophils Relative: 1 %
HCT: 42 % (ref 36.0–46.0)
Hemoglobin: 14.1 g/dL (ref 12.0–15.0)
Immature Granulocytes: 1 %
Lymphocytes Relative: 2 %
Lymphs Abs: 0.2 10*3/uL — ABNORMAL LOW (ref 0.7–4.0)
MCH: 28.9 pg (ref 26.0–34.0)
MCHC: 33.6 g/dL (ref 30.0–36.0)
MCV: 86.1 fL (ref 80.0–100.0)
Monocytes Absolute: 0.5 10*3/uL (ref 0.1–1.0)
Monocytes Relative: 6 %
Neutro Abs: 7.8 10*3/uL — ABNORMAL HIGH (ref 1.7–7.7)
Neutrophils Relative %: 89 %
Platelets: 265 10*3/uL (ref 150–400)
RBC: 4.88 MIL/uL (ref 3.87–5.11)
RDW: 14.3 % (ref 11.5–15.5)
WBC: 8.7 10*3/uL (ref 4.0–10.5)
nRBC: 0 % (ref 0.0–0.2)

## 2020-10-21 LAB — PREGNANCY, URINE: Preg Test, Ur: NEGATIVE

## 2020-10-21 LAB — LIPASE, BLOOD: Lipase: 29 U/L (ref 11–51)

## 2020-10-21 MED ORDER — SODIUM CHLORIDE 0.9 % IV BOLUS
1000.0000 mL | Freq: Once | INTRAVENOUS | Status: AC
  Administered 2020-10-21: 1000 mL via INTRAVENOUS

## 2020-10-21 MED ORDER — ONDANSETRON HCL 4 MG PO TABS: 4.0000 mg | ORAL_TABLET | Freq: Three times a day (TID) | ORAL | 0 refills | Status: AC | PRN

## 2020-10-21 MED ORDER — IOHEXOL 350 MG/ML SOLN
100.0000 mL | Freq: Once | INTRAVENOUS | Status: AC | PRN
  Administered 2020-10-21: 100 mL via INTRAVENOUS

## 2020-10-21 MED ORDER — MAGNESIUM SULFATE 2 GM/50ML IV SOLN
2.0000 g | Freq: Once | INTRAVENOUS | Status: AC
  Administered 2020-10-21: 2 g via INTRAVENOUS
  Filled 2020-10-21: qty 50

## 2020-10-21 MED ORDER — METOPROLOL TARTRATE 5 MG/5ML IV SOLN: 5.0000 mg | Freq: Once | INTRAVENOUS | Status: DC

## 2020-10-21 MED ORDER — ONDANSETRON 4 MG PO TBDP
4.0000 mg | ORAL_TABLET | Freq: Once | ORAL | Status: AC
  Administered 2020-10-21: 4 mg via ORAL
  Filled 2020-10-21: qty 1

## 2020-10-21 MED ORDER — METOPROLOL TARTRATE 5 MG/5ML IV SOLN
5.0000 mg | Freq: Once | INTRAVENOUS | Status: AC
  Administered 2020-10-21: 5 mg via INTRAVENOUS
  Filled 2020-10-21: qty 5

## 2020-10-21 MED ORDER — ONDANSETRON HCL 4 MG/2ML IJ SOLN: 4.0000 mg | Freq: Once | INTRAMUSCULAR | Status: DC

## 2020-10-21 MED ORDER — HALOPERIDOL LACTATE 5 MG/ML IJ SOLN
5.0000 mg | Freq: Once | INTRAMUSCULAR | Status: AC
  Administered 2020-10-21: 5 mg via INTRAVENOUS
  Filled 2020-10-21: qty 1

## 2020-10-21 NOTE — ED Triage Notes (Signed)
Pt reports vomiting onset this am, with chills. Denies abd pain, "just feels sore when I', throwing up".

## 2020-10-21 NOTE — ED Provider Notes (Signed)
[facility] EMERGENCY DEPARTMENT Provider Note   CSN: [id number] Arrival date & time: 10/21/20  1420     History Chief Complaint  Patient presents with   Nausea    Kristi Kennedy is a 30 y.o. female.  The history is provided by the patient.  Emesis Severity:  Moderate Duration:  5 hours Timing:  Constant Progression:  Unchanged Chronicity:  New Recent urination:  Normal Relieved by:  Nothing Worsened by:  Nothing Associated symptoms: no abdominal pain, no arthralgias, no chills, no cough, no fever and no sore throat   Risk factors: no diabetes, no sick contacts and no suspect food intake       History reviewed. No pertinent past medical history.  Patient Active Problem List   Diagnosis Date Noted   Moderate episode of recurrent major depressive disorder (HCC) 10/27/2018   Borderline personality disorder (HCC) 10/27/2018    Past Surgical History:  Procedure Laterality Date   HERNIA REPAIR       OB History   No obstetric history on file.     History reviewed. No pertinent family history.  Social History   Tobacco Use   Smoking status: Never   Smokeless tobacco: Never  Vaping Use   Vaping Use: Never used  Substance Use Topics   Alcohol use: Yes   Drug use: Never    Home Medications Prior to Admission medications   Medication Sig Start Date End Date Taking? Authorizing Provider  ondansetron (ZOFRAN) 4 MG tablet Take 1 tablet (4 mg total) by mouth every 8 (eight) hours as needed for up to 12 doses for nausea or vomiting. 10/21/20  Yes Ramata Strothman, DO  buPROPion (WELLBUTRIN XL) 150 MG 24 hr tablet Take 1 tablet (150 mg total) by mouth daily. 08/05/20   Thresa Ross, MD  cefpodoxime (VANTIN) 100 MG tablet Take 1 tablet (100 mg total) by mouth 2 (two) times daily. 04/27/16   Sharman Cheek, MD  escitalopram (LEXAPRO) 20 MG tablet Take 1 tablet (20 mg total) by mouth daily. 08/05/20   Thresa Ross, MD  naproxen (NAPROSYN) 500 MG tablet  Take 1 tablet (500 mg total) by mouth 2 (two) times daily with a meal. 04/27/16   Sharman Cheek, MD  [name] 3-0.02 MG tablet Take 1 tablet by mouth daily. 03/28/16   [provider]  ondansetron (ZOFRAN ODT) 4 MG disintegrating tablet Take 1 tablet (4 mg total) by mouth every 8 (eight) hours as needed for nausea or vomiting. 04/27/16   Sharman Cheek, MD  QUEtiapine (SEROQUEL) 200 MG tablet TAKE ONE TABLET POI EVERY NIGHT AT BEDTIME 10/06/19   Thresa Ross, MD    Allergies    Sulfa antibiotics  Review of Systems   Review of Systems  Constitutional:  Negative for chills and fever.  HENT:  Negative for ear pain and sore throat.   Eyes:  Negative for pain and visual disturbance.  Respiratory:  Negative for cough and shortness of breath.   Cardiovascular:  Negative for chest pain and palpitations.  Gastrointestinal:  Positive for nausea and vomiting. Negative for abdominal pain.  Genitourinary:  Negative for dysuria and hematuria.  Musculoskeletal:  Negative for arthralgias and back pain.  Skin:  Negative for color change and rash.  Neurological:  Negative for seizures and syncope.  All other systems reviewed and are negative.  Physical Exam Updated Vital Signs BP (!) 100/57 (BP Location: Right Arm)   Pulse (!) 118   Temp 98.8 F (37.1 C) (Oral)  Resp (!) 28   Ht 5\' 6"  (1.676 m)   Wt 63.5 kg   LMP 10/09/2020   SpO2 97%   BMI 22.60 kg/m   Physical Exam Vitals and nursing note reviewed.  Constitutional:      General: She is not in acute distress.    Appearance: She is well-developed. She is ill-appearing.  HENT:     Head: Normocephalic and atraumatic.     Mouth/Throat:     Mouth: Mucous membranes are moist.  Eyes:     Extraocular Movements: Extraocular movements intact.     Conjunctiva/sclera: Conjunctivae normal.     Pupils: Pupils are equal, round, and reactive to light.  Cardiovascular:     Rate and Rhythm: Regular rhythm. Tachycardia present.     Pulses:  Normal pulses.     Heart sounds: Normal heart sounds. No murmur heard. Pulmonary:     Effort: Pulmonary effort is normal. No respiratory distress.     Breath sounds: Normal breath sounds.  Abdominal:     General: Abdomen is flat.     Palpations: Abdomen is soft.     Tenderness: There is no abdominal tenderness. There is no guarding or rebound.     Hernia: No hernia is present.  Musculoskeletal:        General: Normal range of motion.     Cervical back: Neck supple.  Skin:    General: Skin is warm and dry.     Capillary Refill: Capillary refill takes less than 2 seconds.  Neurological:     General: No focal deficit present.     Mental Status: She is alert.  Psychiatric:        Mood and Affect: Mood normal.    ED Results / Procedures / Treatments   Labs (all labs ordered are listed, but only abnormal results are displayed) Labs Reviewed  CBC WITH DIFFERENTIAL/PLATELET - Abnormal; Notable for the following components:      Result Value   Neutro Abs 7.8 (*)    Lymphs Abs 0.2 (*)    All other components within normal limits  COMPREHENSIVE METABOLIC PANEL - Abnormal; Notable for the following components:   Glucose, Bld 118 (*)    Total Protein 8.7 (*)    All other components within normal limits  URINALYSIS, ROUTINE W REFLEX MICROSCOPIC - Abnormal; Notable for the following components:   Ketones, ur >80 (*)    All other components within normal limits  URINE CULTURE  LIPASE, BLOOD  PREGNANCY, URINE  MAGNESIUM    EKG EKG Interpretation  Date/Time:  Monday October 21 2020 15:17:07 EDT Ventricular Rate:  129 PR Interval:  143 QRS Duration: 90 QT Interval:  297 QTC Calculation: 435 R Axis:   84 Text Interpretation: Sinus tachycardia Borderline T abnormalities, inferior leads Confirmed by [id number] ([id number]) on 10/21/2020 3:58:01 PM  Radiology CT Angio Chest PE W and/or Wo Contrast  Result Date: 10/21/2020 CLINICAL DATA:  Vomiting.  Chills. EXAM: CT CHEST WITH CONTRAST CT  ABDOMEN AND PELVIS WITH  CONTRAST TECHNIQUE: Multidetector CT imaging of the chest was performed during intravenous contrast administration. Multidetector CT imaging of the abdomen and pelvis was performed following the standard protocol during bolus administration of intravenous contrast. CONTRAST:  12/22/2020 OMNIPAQUE IOHEXOL 350 MG/ML SOLN COMPARISON:  CT abdomen 04/27/2016, chest radiograph same day FINDINGS: CT CHEST FINDINGS Cardiovascular: No filling defects within the pulmonary arteries to suggest acute pulmonary embolism. Aorta appears normal. Cardiac motion at the root of the aorta. No pericardial  fluid. Small amount residual thymus in the anterior mediastinum Mediastinum/Nodes: No axillary or supraclavicular adenopathy. No mediastinal or hilar adenopathy. No pericardial fluid. Esophagus normal. Lungs/Pleura: No pulmonary infarction. No pneumonia. Mild basilar atelectasis. No nodularity. Musculoskeletal: No acute osseous abnormality. CT ABDOMEN AND PELVIS FINDINGS Hepatobiliary: No focal hepatic lesion. No biliary duct dilatation. Common bile duct is normal. Pancreas: Pancreas is normal. No ductal dilatation. No pancreatic inflammation. Spleen: Normal spleen Adrenals/urinary tract: Adrenal glands and kidneys are normal. The ureters and bladder normal. Stomach/Bowel: Stomach, duodenum small-bowel normal. Terminal ileum normal. Appendix is partially imaged (image 67/4) and is normal. Ascending, transverse, and descending colon normal. Rectosigmoid colon normal. Vascular/Lymphatic: Abdominal aorta is normal caliber. No periportal or retroperitoneal adenopathy. No pelvic adenopathy. Reproductive: Uterus and adnexa are normal. Dominant follicle in the LEFT ovary measures 19 mm Other: No free fluid. Musculoskeletal: No aggressive osseous lesion. IMPRESSION: Chest Impression: 1. No evidence acute pulmonary embolism. 2. Mild basilar atelectasis. 3. No pneumonia or infarction. Abdomen / Pelvis Impression: 1. No acute  findings in the abdomen pelvis. 2. Normal gallbladder and appendix. 3. No obstructive uropathy. Electronically Signed   By: Genevive Bi M.D.   On: 10/21/2020 17:52   CT ABDOMEN PELVIS W CONTRAST  Result Date: 10/21/2020 CLINICAL DATA:  Vomiting.  Chills. EXAM: CT CHEST WITH CONTRAST CT ABDOMEN AND PELVIS WITH  CONTRAST TECHNIQUE: Multidetector CT imaging of the chest was performed during intravenous contrast administration. Multidetector CT imaging of the abdomen and pelvis was performed following the standard protocol during bolus administration of intravenous contrast. CONTRAST:  OMNIPAQUE IOHEXOL 350 MG/ML SOLN COMPARISON:  CT abdomen 04/27/2016, chest radiograph same day FINDINGS: CT CHEST FINDINGS Cardiovascular: No filling defects within the pulmonary arteries to suggest acute pulmonary embolism. Aorta appears normal. Cardiac motion at the root of the aorta. No pericardial fluid. Small amount residual thymus in the anterior mediastinum Mediastinum/Nodes: No axillary or supraclavicular adenopathy. No mediastinal or hilar adenopathy. No pericardial fluid. Esophagus normal. Lungs/Pleura: No pulmonary infarction. No pneumonia. Mild basilar atelectasis. No nodularity. Musculoskeletal: No acute osseous abnormality. CT ABDOMEN AND PELVIS FINDINGS Hepatobiliary: No focal hepatic lesion. No biliary duct dilatation. Common bile duct is normal. Pancreas: Pancreas is normal. No ductal dilatation. No pancreatic inflammation. Spleen: Normal spleen Adrenals/urinary tract: Adrenal glands and kidneys are normal. The ureters and bladder normal. Stomach/Bowel: Stomach, duodenum small-bowel normal. Terminal ileum normal. Appendix is partially imaged (image 67/4) and is normal. Ascending, transverse, and descending colon normal. Rectosigmoid colon normal. Vascular/Lymphatic: Abdominal aorta is normal caliber. No periportal or retroperitoneal adenopathy. No pelvic adenopathy. Reproductive: Uterus and adnexa are  normal. Dominant follicle in the LEFT ovary measures 19 mm Other: No free fluid. Musculoskeletal: No aggressive osseous lesion. IMPRESSION: Chest Impression: 1. No evidence acute pulmonary embolism. 2. Mild basilar atelectasis. 3. No pneumonia or infarction. Abdomen / Pelvis Impression: 1. No acute findings in the abdomen pelvis. 2. Normal gallbladder and appendix. 3. No obstructive uropathy. Electronically Signed   By: Genevive Bi M.D.   On: 10/21/2020 17:52   DG Chest Portable 1 View  Result Date: 10/21/2020 CLINICAL DATA:  Vomiting EXAM: PORTABLE CHEST 1 VIEW COMPARISON:  None. FINDINGS: The heart size and mediastinal contours are within normal limits. Both lungs are clear. The visualized skeletal structures are unremarkable. IMPRESSION: No active disease. Electronically Signed   By: Jasmine Pang M.D.   On: 10/21/2020 16:58    Procedures Procedures   Medications Ordered in ED Medications  sodium chloride 0.9 % bolus 1,000 mL (0  mLs Intravenous Stopped 10/21/20 1617)  haloperidol lactate (HALDOL) injection 5 mg (5 mg Intravenous Given 10/21/20 1521)  ondansetron (ZOFRAN-ODT) disintegrating tablet 4 mg (4 mg Oral Given 10/21/20 1521)  sodium chloride 0.9 % bolus 1,000 mL ( Intravenous Stopped 10/21/20 1919)  magnesium sulfate IVPB 2 g 50 mL (0 g Intravenous Stopped 10/21/20 1831)  iohexol (OMNIPAQUE) 350 MG/ML injection 100 mL (100 mLs Intravenous Contrast Given 10/21/20 1730)  metoprolol tartrate (LOPRESSOR) injection 5 mg (5 mg Intravenous Given 10/21/20 1823)    ED Course  I have reviewed the triage vital signs and the nursing notes.  Pertinent labs & imaging results that were available during my care of the patient were reviewed by me and considered in my medical decision making (see chart for details).    MDM Rules/Calculators/A&P                          Lars PinksKristena Ostermann is here with nausea and vomiting.  Normal vitals except for mild tachycardia.  Nausea and vomiting started this  morning.  Fairly persistent.  Denies any sick contacts or suspicious food intake.  Denies any abdominal pain.  No history of the same.  Denies any urine symptoms.  Denies any marijuana use.  Overall she appears uncomfortable and actively dry heaving on exam.  EKG shows sinus tachycardia.  Will give IV Haldol, fluid bolus and check basic labs including urinalysis and reevaluate.  Have low suspicion for appendicitis or colitis or bowel obstruction given no abdominal tenderness.  Urinalysis negative for infection.  No significant leukocytosis, anemia, electrolyte ab Mody, kidney injury.  Felt better after Haldol and Zofran.  Tachycardia was still persistent therefore PE study and CT abdomen and pelvis was pursued.  Patient had no blood clot, no pulmonary infection.  No intra-abdominal infection.  Given additional dose IV fluids and IV Lopressor and will reevaluate.  Monitor shows sinus tachycardia at 130s.  Large ketones in the urinalysis and suspect tachycardia is likely from dehydration despite no AKI.  She might have given herself some type of atrial tachycardia as well from the vomiting.  After dose of Lopressor.  Patient with sinus tachycardia at 105.  Will allow fluids to finish and reevaluate.  Patient feeling much better after IV fluids.  Will prescribe Zofran to use as needed.  Discharged in good condition.  Overall suspect viral process.  This chart was dictated using voice recognition software.  Despite best efforts to proofread,  errors can occur which can change the documentation meaning.   Final Clinical Impression(s) / ED Diagnoses Final diagnoses:  Nausea    Rx / DC Orders ED Discharge Orders          Ordered    ondansetron (ZOFRAN) 4 MG tablet  Every 8 hours PRN        10/21/20 1928             Virgina NorfolkCuratolo, Cynai Skeens, DO 10/21/20 1929

## 2020-10-22 LAB — URINE CULTURE: Culture: <10000 — AB

## 2020-10-25 ENCOUNTER — Other Ambulatory Visit (HOSPITAL_COMMUNITY): Payer: Self-pay | Admitting: Psychiatry

## 2020-11-04 ENCOUNTER — Telehealth (INDEPENDENT_AMBULATORY_CARE_PROVIDER_SITE_OTHER): Payer: 59 | Admitting: Psychiatry

## 2020-11-04 ENCOUNTER — Encounter (HOSPITAL_COMMUNITY): Payer: Self-pay | Admitting: Psychiatry

## 2020-11-04 DIAGNOSIS — F603 Borderline personality disorder: Secondary | ICD-10-CM | POA: Diagnosis not present

## 2020-11-04 DIAGNOSIS — F331 Major depressive disorder, recurrent, moderate: Secondary | ICD-10-CM | POA: Diagnosis not present
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
## 2020-11-24 ENCOUNTER — Other Ambulatory Visit (HOSPITAL_COMMUNITY): Payer: Self-pay | Admitting: Psychiatry

## 2020-12-17 ENCOUNTER — Other Ambulatory Visit (HOSPITAL_COMMUNITY): Payer: Self-pay

## 2020-12-17 MED ORDER — QUETIAPINE FUMARATE 200 MG PO TABS: ORAL_TABLET | ORAL | 1 refills | Status: DC

## 2020-12-24 ENCOUNTER — Other Ambulatory Visit (HOSPITAL_COMMUNITY): Payer: Self-pay | Admitting: Psychiatry

## 2021-01-23 ENCOUNTER — Other Ambulatory Visit (HOSPITAL_COMMUNITY): Payer: Self-pay | Admitting: Psychiatry

## 2021-02-22 ENCOUNTER — Other Ambulatory Visit (HOSPITAL_COMMUNITY): Payer: Self-pay | Admitting: Psychiatry

## 2021-03-04 ENCOUNTER — Other Ambulatory Visit (HOSPITAL_COMMUNITY): Payer: Self-pay | Admitting: Psychiatry

## 2021-03-05 ENCOUNTER — Telehealth (INDEPENDENT_AMBULATORY_CARE_PROVIDER_SITE_OTHER): Payer: 59 | Admitting: Psychiatry

## 2021-03-05 ENCOUNTER — Encounter (HOSPITAL_COMMUNITY): Payer: Self-pay | Admitting: Psychiatry

## 2021-03-05 DIAGNOSIS — F331 Major depressive disorder, recurrent, moderate: Secondary | ICD-10-CM | POA: Diagnosis not present

## 2021-03-05 DIAGNOSIS — F603 Borderline personality disorder: Secondary | ICD-10-CM | POA: Diagnosis not present
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
## 2021-04-16 ENCOUNTER — Other Ambulatory Visit (HOSPITAL_COMMUNITY): Payer: Self-pay | Admitting: Psychiatry

## 2021-04-30 ENCOUNTER — Other Ambulatory Visit (HOSPITAL_COMMUNITY): Payer: Self-pay | Admitting: Psychiatry

## 2021-06-04 ENCOUNTER — Encounter (HOSPITAL_COMMUNITY): Payer: Self-pay | Admitting: Psychiatry

## 2021-06-04 ENCOUNTER — Telehealth (INDEPENDENT_AMBULATORY_CARE_PROVIDER_SITE_OTHER): Payer: 59 | Admitting: Psychiatry

## 2021-06-04 DIAGNOSIS — F331 Major depressive disorder, recurrent, moderate: Secondary | ICD-10-CM

## 2021-06-04 DIAGNOSIS — F5102 Adjustment insomnia: Secondary | ICD-10-CM

## 2021-06-04 DIAGNOSIS — F603 Borderline personality disorder: Secondary | ICD-10-CM

## 2021-06-04 MED ORDER — QUETIAPINE FUMARATE 200 MG PO TABS: ORAL_TABLET | ORAL | 2 refills | Status: DC
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
## 2021-06-29 ENCOUNTER — Other Ambulatory Visit (HOSPITAL_COMMUNITY): Payer: Self-pay | Admitting: Psychiatry

## 2021-08-28 ENCOUNTER — Other Ambulatory Visit (HOSPITAL_COMMUNITY): Payer: Self-pay | Admitting: Psychiatry

## 2021-09-01 ENCOUNTER — Other Ambulatory Visit (HOSPITAL_COMMUNITY): Payer: Self-pay | Admitting: Psychiatry

## 2021-09-01 ENCOUNTER — Other Ambulatory Visit (HOSPITAL_COMMUNITY): Payer: Self-pay

## 2021-09-01 MED ORDER — ESCITALOPRAM OXALATE 20 MG PO TABS: 20.0000 mg | ORAL_TABLET | Freq: Every day | ORAL | 1 refills | Status: DC

## 2021-09-17 ENCOUNTER — Telehealth (INDEPENDENT_AMBULATORY_CARE_PROVIDER_SITE_OTHER): Payer: Self-pay | Admitting: Psychiatry

## 2021-09-17 ENCOUNTER — Encounter (HOSPITAL_COMMUNITY): Payer: Self-pay | Admitting: Psychiatry

## 2021-09-17 DIAGNOSIS — F603 Borderline personality disorder: Secondary | ICD-10-CM

## 2021-09-17 DIAGNOSIS — F331 Major depressive disorder, recurrent, moderate: Secondary | ICD-10-CM

## 2021-09-17 DIAGNOSIS — F5102 Adjustment insomnia: Secondary | ICD-10-CM

## 2021-09-17 MED ORDER — QUETIAPINE FUMARATE 200 MG PO TABS: ORAL_TABLET | ORAL | 2 refills | Status: AC
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
## 2021-10-27 ENCOUNTER — Other Ambulatory Visit (HOSPITAL_COMMUNITY): Payer: Self-pay | Admitting: Psychiatry

## 2021-12-25 IMAGING — DX DG CHEST 1V PORT
1 series · 1 of 1 positions shown · non-contrast
Comparison: None.

CLINICAL DATA: Vomiting

EXAM:
PORTABLE CHEST 1 VIEW

[chest ap]
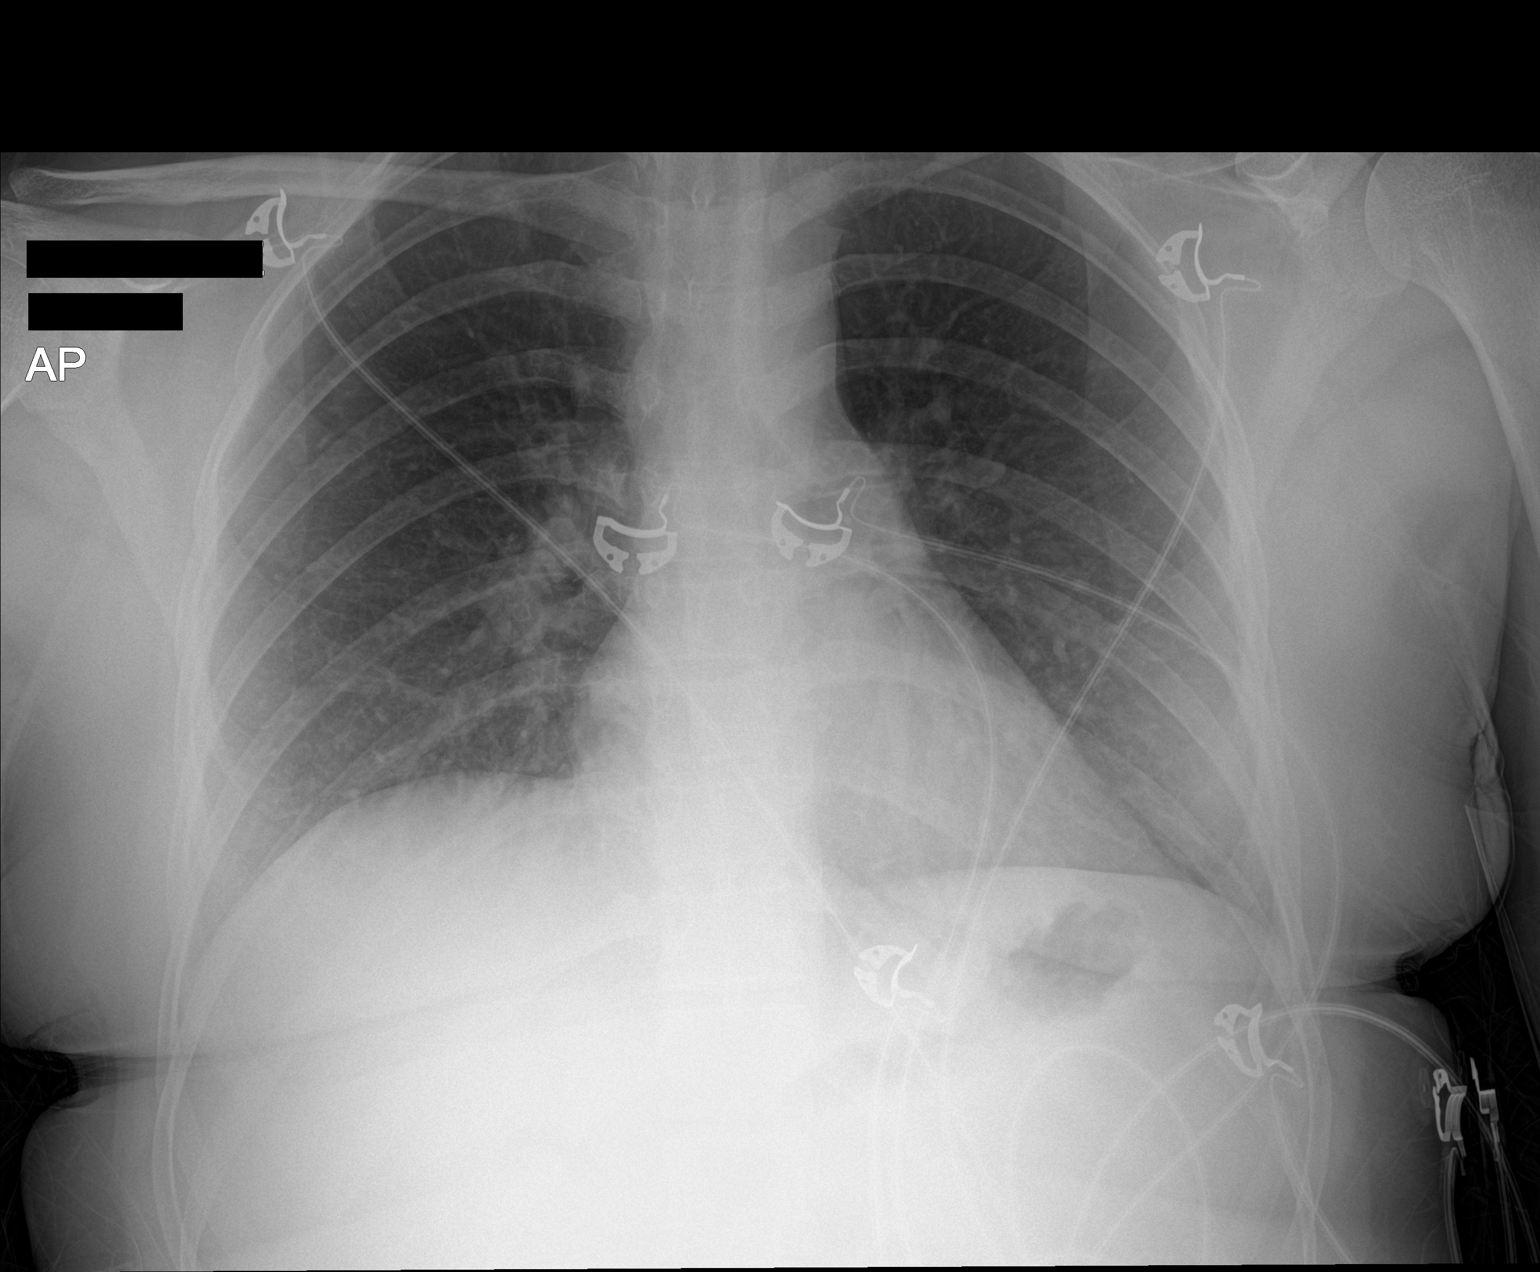

[1 of 1 positions shown; findings below may reference images not displayed]

FINDINGS: The heart size and mediastinal contours are within normal limits.
Both lungs are clear. The visualized skeletal structures are
unremarkable.
IMPRESSION: No active disease.

## 2021-12-26 ENCOUNTER — Other Ambulatory Visit (HOSPITAL_COMMUNITY): Payer: Self-pay | Admitting: Psychiatry

## 2022-01-14 ENCOUNTER — Telehealth (INDEPENDENT_AMBULATORY_CARE_PROVIDER_SITE_OTHER): Payer: Self-pay | Admitting: Psychiatry

## 2022-01-14 ENCOUNTER — Encounter (HOSPITAL_COMMUNITY): Payer: Self-pay | Admitting: Psychiatry

## 2022-01-14 DIAGNOSIS — F5102 Adjustment insomnia: Secondary | ICD-10-CM

## 2022-01-14 DIAGNOSIS — F603 Borderline personality disorder: Secondary | ICD-10-CM

## 2022-01-14 DIAGNOSIS — F331 Major depressive disorder, recurrent, moderate: Secondary | ICD-10-CM
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
## 2022-02-26 ENCOUNTER — Other Ambulatory Visit (HOSPITAL_COMMUNITY): Payer: Self-pay | Admitting: Psychiatry

## 2022-04-29 ENCOUNTER — Other Ambulatory Visit (HOSPITAL_COMMUNITY): Payer: Self-pay | Admitting: Psychiatry

## 2022-05-20 ENCOUNTER — Telehealth (HOSPITAL_COMMUNITY): Payer: Self-pay | Admitting: Psychiatry

## 2024-02-02 ENCOUNTER — Other Ambulatory Visit: Payer: Self-pay | Admitting: Obstetrics and Gynecology

## 2024-02-02 DIAGNOSIS — N6310 Unspecified lump in the right breast, unspecified quadrant: Secondary | ICD-10-CM

## 2024-02-09 ENCOUNTER — Ambulatory Visit
Admission: RE | Admit: 2024-02-09 | Discharge: 2024-02-09 | Disposition: A | Source: Ambulatory Visit | Attending: Obstetrics and Gynecology

## 2024-02-09 ENCOUNTER — Other Ambulatory Visit: Payer: Self-pay | Admitting: Obstetrics and Gynecology

## 2024-02-09 DIAGNOSIS — N6313 Unspecified lump in the right breast, lower outer quadrant: Secondary | ICD-10-CM

## 2024-02-09 DIAGNOSIS — N6310 Unspecified lump in the right breast, unspecified quadrant: Secondary | ICD-10-CM

## 2024-02-09 DIAGNOSIS — R921 Mammographic calcification found on diagnostic imaging of breast: Secondary | ICD-10-CM

## 2024-08-10 ENCOUNTER — Other Ambulatory Visit

## 2024-08-10 ENCOUNTER — Encounter
